# Patient Record
Sex: Female | Born: 1980 | Race: White | Hispanic: Yes | Marital: Married | Smoking: Never smoker
Health system: Southern US, Community
[De-identification: ages and names within clinical notes are randomized; demographics above are authoritative.]

## PROBLEM LIST (undated history)

## (undated) HISTORY — PX: OTHER SURGICAL HISTORY: SHX169

---

## 2008-07-27 ENCOUNTER — Observation Stay: Payer: Self-pay | Admitting: Obstetrics and Gynecology

## 2008-07-31 ENCOUNTER — Inpatient Hospital Stay: Payer: Self-pay | Admitting: Obstetrics and Gynecology

## 2010-10-24 LAB — HM HIV SCREENING LAB: HM HIV Screening: NEGATIVE

## 2010-11-03 ENCOUNTER — Emergency Department: Payer: Self-pay | Admitting: Emergency Medicine

## 2010-12-08 ENCOUNTER — Ambulatory Visit: Payer: Self-pay | Admitting: Advanced Practice Midwife

## 2011-05-20 ENCOUNTER — Inpatient Hospital Stay: Payer: Self-pay

## 2011-05-20 LAB — CBC WITH DIFFERENTIAL/PLATELET
Basophil #: 0 10*3/uL (ref 0.0–0.1)
Basophil %: 0.1 %
Eosinophil %: 0.2 %
HCT: 37.3 % (ref 35.0–47.0)
Lymphocyte #: 1.3 10*3/uL (ref 1.0–3.6)
MCH: 31.3 pg (ref 26.0–34.0)
MCHC: 33.9 g/dL (ref 32.0–36.0)
MCV: 92 fL (ref 80–100)
Monocyte #: 0.5 x10 3/mm (ref 0.2–0.9)
Monocyte %: 3.7 %
Neutrophil #: 13 10*3/uL — ABNORMAL HIGH (ref 1.4–6.5)
Neutrophil %: 87.5 %
RDW: 15.7 % — ABNORMAL HIGH (ref 11.5–14.5)
WBC: 14.9 10*3/uL — ABNORMAL HIGH (ref 3.6–11.0)

## 2011-05-21 LAB — HEMATOCRIT: HCT: 33.8 % — ABNORMAL LOW (ref 35.0–47.0)

## 2013-05-10 IMAGING — US US OB US >=[ID] SNGL FETUS
1 series · 17 of 28 positions shown · non-contrast
Comparison: none

REASON FOR EXAM: dating
COMMENTS:

PROCEDURE:     US  - US OB GREATER/OR EQUAL TO [id number]  - December 08, 2010 [DATE]
RESULT:     Comparison: None.
TECHNIQUE: Multiple grayscale and color Doppler images were obtained of the
pelvis.

[Series 1: us ob us >=(id) sngl fetus · 17 of 75 slices shown]
[im 1/75]
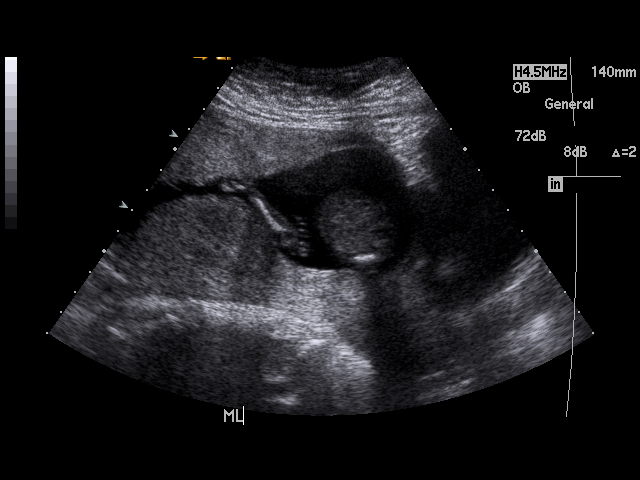
[im 6/75]
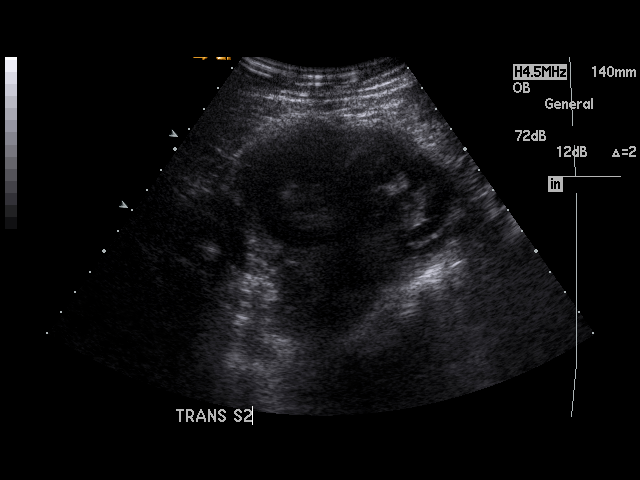
[im 11/75]
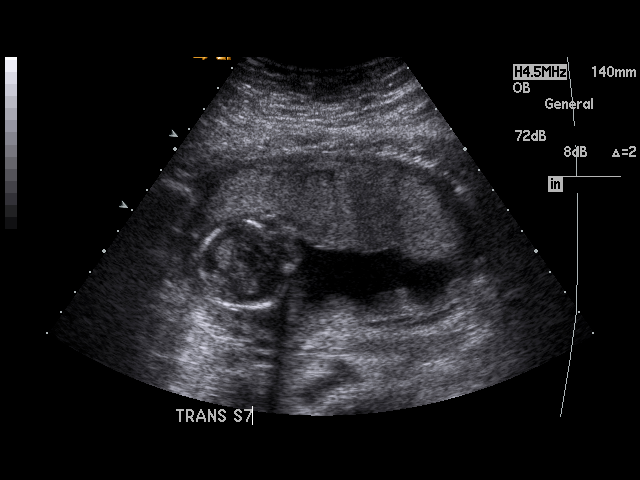
[im 14/75]
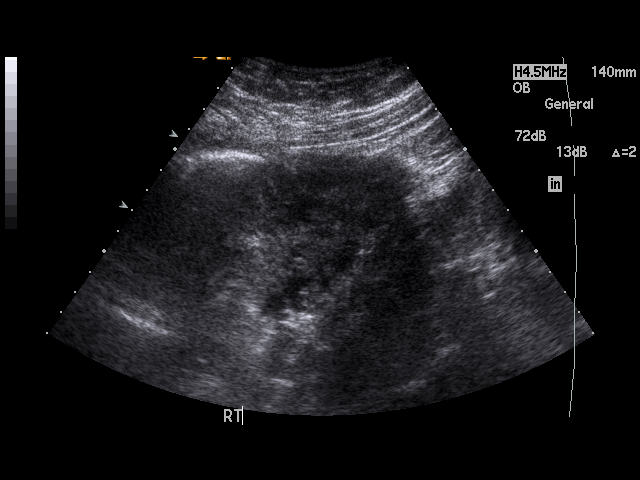
[im 20/75]
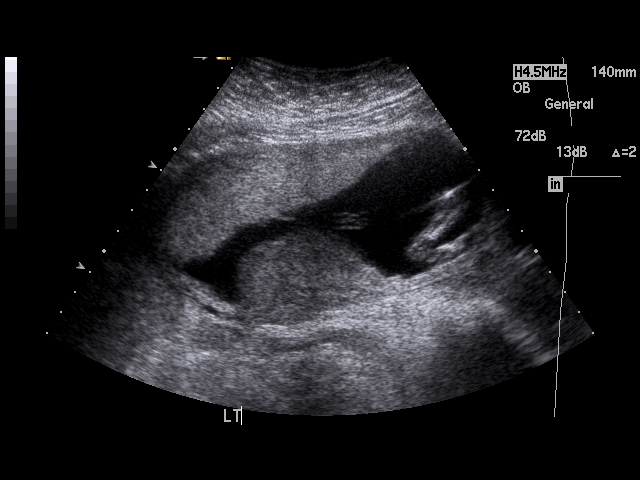
[im 25/75]
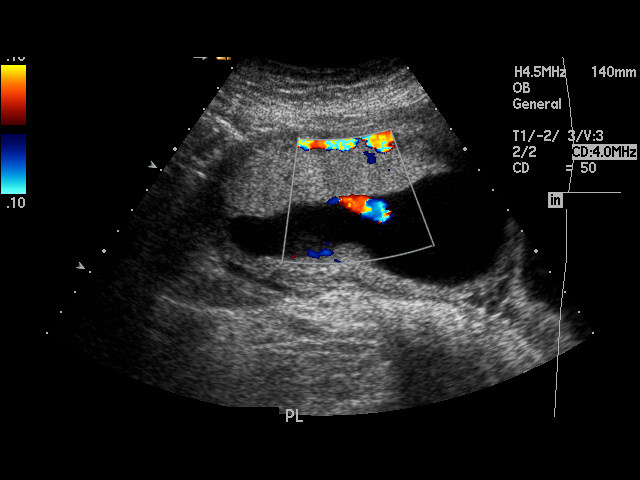
[im 28/75]
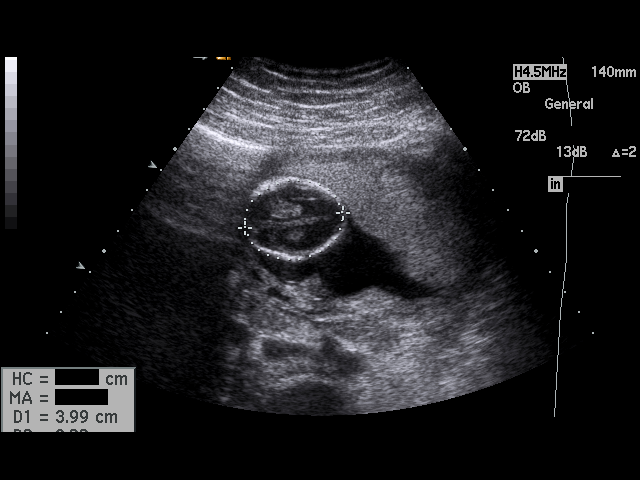
[im 33/75]
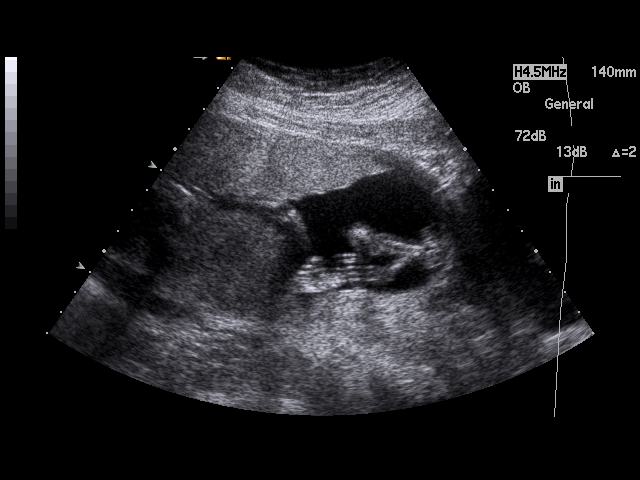
[im 39/75]
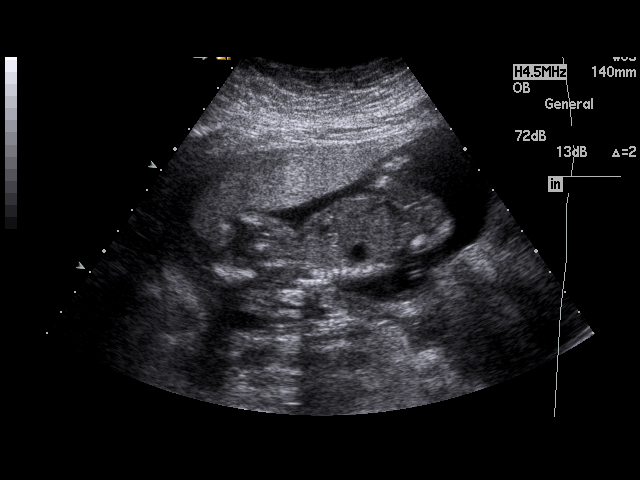
[im 42/75]
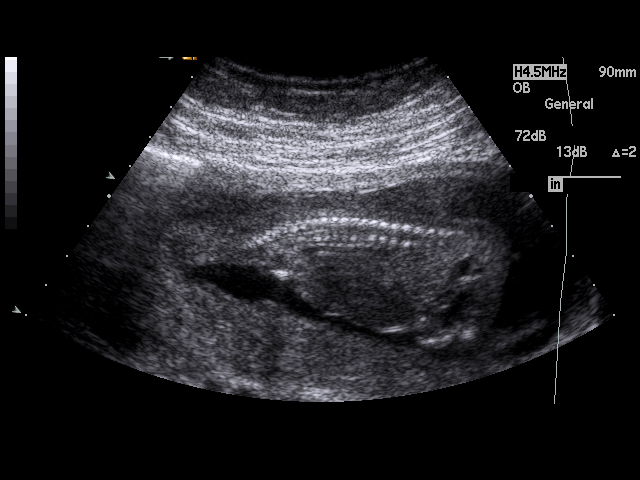
[im 47/75]
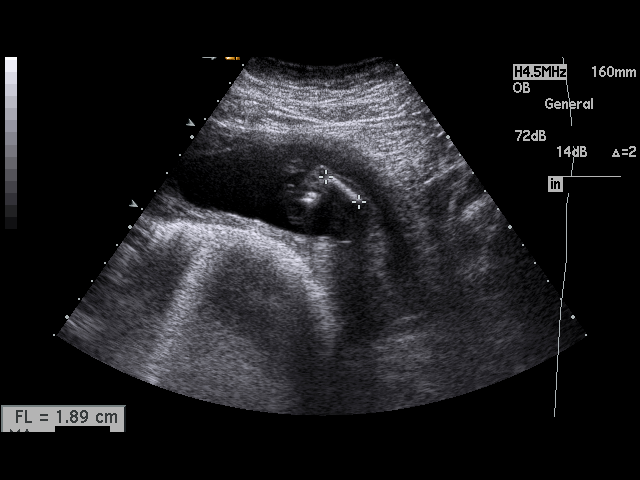
[im 50/75]
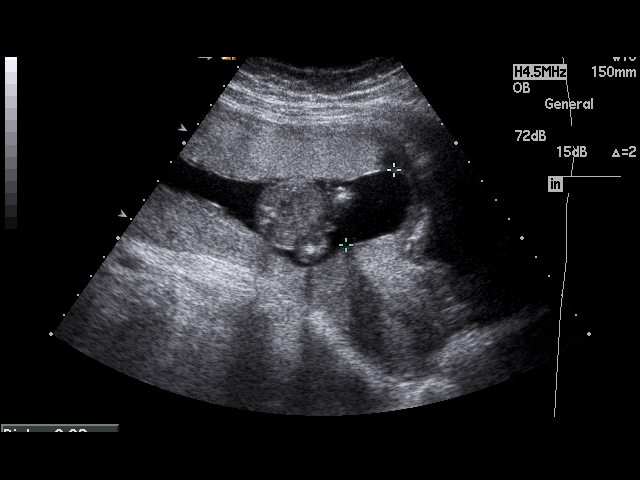
[im 55/75]
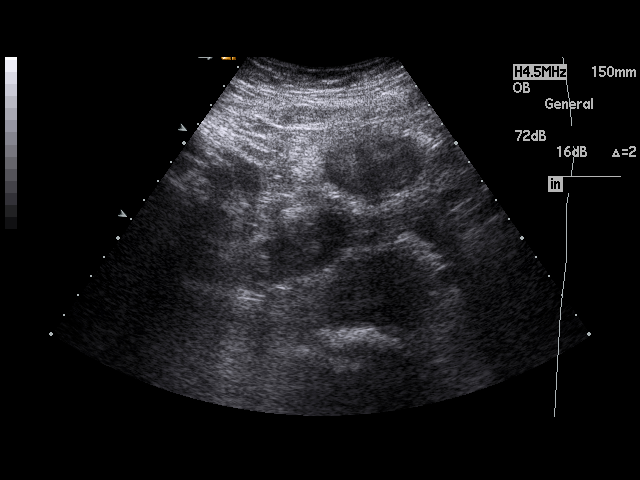
[im 61/75]
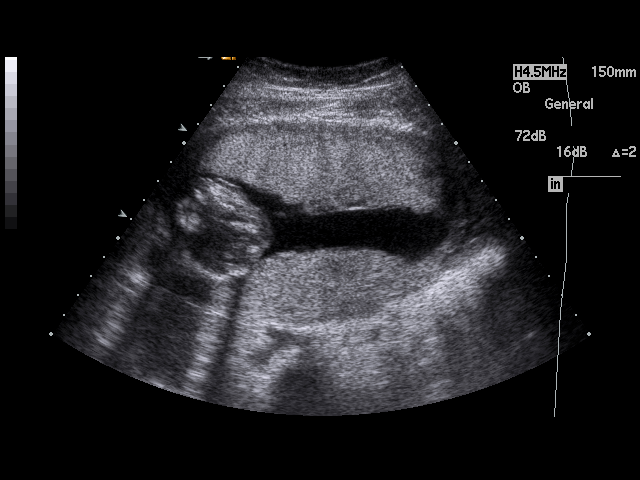
[im 64/75]
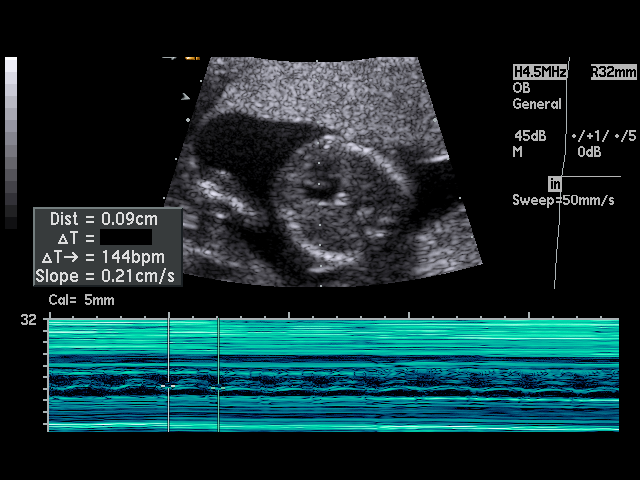
[im 69/75]
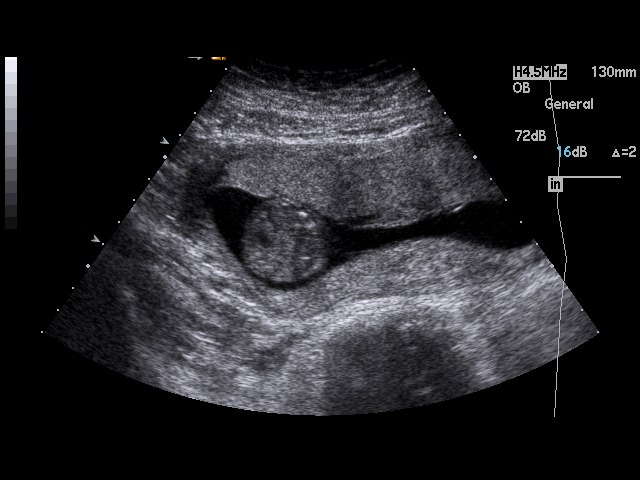
[im 75/75]
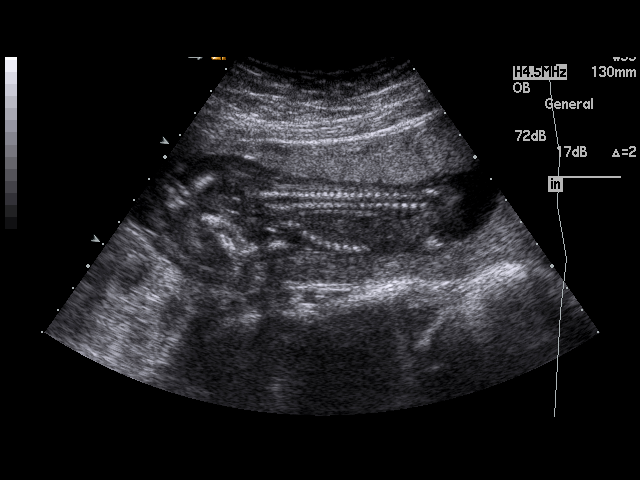

[17 of 28 positions shown; findings below may reference images not displayed]

FINDINGS: There is a single live intrauterine pregnancy with estimated gestational age
of 15 weeks 5 days. The cervix measures 3.9 cm, and is closed. The placenta
is in an anterior location. No evidence of placenta previa. The fetal heart
rate was 144 beats per minute. No fetal anomalies were identified. However,
please note this is not a detailed fetal anatomic study secondary to the
early gestational age. The estimated fetal weight was 162 + / - 22 grams.
IMPRESSION: Single live intrauterine pregnancy with estimated gestational age of 15
weeks 5 days. Please note, this is not a detailed fetal anatomic study,
secondary to the early gestational age.

## 2014-05-19 NOTE — H&P (Signed)
L&D Evaluation:  History:   HPI 34 yo [id number] with LMP of 08/22/10 & EDD of 05/29/11 with PNC at [facility] significant for Hep B hx in [place] at age 34 to 549?, UTI's, PP hemorrhage with pt not remembering whether she received blood. [facility] in [facility] with BBOW and ready to push.    Presents with contractions    Patient's Medical History Hep B, UTI's, PP hemorrhage    Patient's Surgical History none    Medications Pre Natal Vitamins    Allergies NKDA    Social History tobacco  EtOH  drugs  smoked tobacco  in high school, MJ in HS, ETOH occas    Family History Non-Contributory   ROS:   ROS All systems were reviewed.  HEENT, CNS, GI, GU, Respiratory, CV, Renal and Musculoskeletal systems were found to be normal.   Exam:   Vital Signs stable  113/90 during pushing    General no apparent distress    Mental Status clear    Chest clear    Heart normal sinus rhythm, no murmur/gallop/rubs    Abdomen gravid, non-tender    Estimated Fetal Weight Average for gestational age    Back no CVAT    Edema 1+    Reflexes 1+    Clonus negative    Pelvic delivered    Mebranes Ruptured, 1011 AROM, mod meconium    Description brown    FHT normal rate with no decels, FHR on arrival,    Ucx regular    Skin dry    Lymph no lymphadenopathy   Impression:   Impression active labor, IUP at 38 weeks   Plan:   Plan Pt delivered prior to H&P being done   Electronic Signatures: Sharee PimpleJones, Esme Freund W (CNM)  (Signed 11-May-13 10:37)  Authored: L&D Evaluation   Last Updated: 11-May-13 10:37 by Sharee PimpleJones, Clemma Johnsen W (CNM)

## 2014-06-04 DIAGNOSIS — Z8619 Personal history of other infectious and parasitic diseases: Secondary | ICD-10-CM | POA: Insufficient documentation

## 2014-06-04 DIAGNOSIS — E663 Overweight: Secondary | ICD-10-CM | POA: Insufficient documentation

## 2015-06-29 DIAGNOSIS — I10 Essential (primary) hypertension: Secondary | ICD-10-CM | POA: Insufficient documentation

## 2015-06-29 LAB — HM PAP SMEAR: HM Pap smear: NEGATIVE

## 2018-08-01 DIAGNOSIS — E663 Overweight: Secondary | ICD-10-CM

## 2018-08-01 DIAGNOSIS — I10 Essential (primary) hypertension: Secondary | ICD-10-CM

## 2018-08-01 DIAGNOSIS — Z8619 Personal history of other infectious and parasitic diseases: Secondary | ICD-10-CM

## 2018-08-13 ENCOUNTER — Other Ambulatory Visit: Payer: Self-pay

## 2018-08-13 ENCOUNTER — Ambulatory Visit (LOCAL_COMMUNITY_HEALTH_CENTER): Payer: Self-pay

## 2018-08-13 VITALS — BP 134/92 | Ht 63.5 in | Wt 186.5 lb

## 2018-08-13 DIAGNOSIS — Z30011 Encounter for initial prescription of contraceptive pills: Secondary | ICD-10-CM

## 2018-08-13 DIAGNOSIS — Z3009 Encounter for other general counseling and advice on contraception: Secondary | ICD-10-CM

## 2018-08-13 DIAGNOSIS — Z3041 Encounter for surveillance of contraceptive pills: Secondary | ICD-10-CM

## 2018-08-13 MED ORDER — NORETHINDRONE 0.35 MG PO TABS: 1.0000 | ORAL_TABLET | Freq: Every day | ORAL | 0 refills | Status: AC

## 2018-08-13 NOTE — Progress Notes (Signed)
Last physical with [facility] 07/19/2017, order for #13 packs Micronor written and dispensed. RV on 05/14/2018, pt stated she lost OCP, order for #4 packs Norethindrone written and dispensed.

## 2018-08-13 NOTE — Progress Notes (Signed)
Consulted with provider regarding pt request and details regarding BP, menses, & physical. Per verbal order by Antoine Primas, PA, dispensed #3 packs Norethindrone, take 1 tab at the same time every day; also offered UPT, pt refused. Pt counseled that if she misses OCP or takes them late to use a back-up method like condoms.

## 2019-07-10 ENCOUNTER — Other Ambulatory Visit: Payer: Self-pay

## 2019-07-10 ENCOUNTER — Encounter: Payer: Self-pay | Admitting: Physician Assistant

## 2019-07-10 ENCOUNTER — Ambulatory Visit (LOCAL_COMMUNITY_HEALTH_CENTER): Payer: Self-pay | Admitting: Physician Assistant

## 2019-07-10 VITALS — BP 131/91 | Ht 63.0 in | Wt 171.0 lb

## 2019-07-10 DIAGNOSIS — Z0000 Encounter for general adult medical examination without abnormal findings: Secondary | ICD-10-CM

## 2019-07-10 DIAGNOSIS — Z3009 Encounter for other general counseling and advice on contraception: Secondary | ICD-10-CM

## 2019-07-10 DIAGNOSIS — Z113 Encounter for screening for infections with a predominantly sexual mode of transmission: Secondary | ICD-10-CM

## 2019-07-10 DIAGNOSIS — Z3049 Encounter for surveillance of other contraceptives: Secondary | ICD-10-CM

## 2019-07-10 LAB — WET PREP FOR TRICH, YEAST, CLUE
Trichomonas Exam: NEGATIVE
Yeast Exam: NEGATIVE

## 2019-07-10 NOTE — Progress Notes (Signed)
Family Planning Visit- Repeat Yearly Visit  Subjective:  Marilyn Evans is a 39 y.o. [phone]  being seen today for an well woman visit and to discuss family planning options.    She is currently using none for pregnancy prevention. Patient reports she does not  want a pregnancy in the next year. Patient  has Benign hypertension; History of hepatitis B; and Overweight on their problem list.  Chief Complaint  Patient presents with  . Contraception    PE    Patient reports that she does not take any medicine for her high BP, she does not have a PCP right now.  Is concerned that there is something wrong because she has had a lot of yeast infections and urine infections during the last few months.  States that she was last treated for a UTI in April.  Reports that she had been "cleaninig out" her vagina by putting her fingers inside to get the discharge out.  States that she drinks water, but not too much since if she urinates a lot she gets uncomfortable.  Reports some vaginal discharge with itching off and on, especially since she d/c her OCs.    Patient denies other concerns today and changes to personal and family history.   See flowsheet for other program required questions.   Body mass index is 30.29 kg/m. - Patient is eligible for diabetes screening based on BMI and age >36?  yes HA1C ordered? No, patient declines today.   Patient reports 1 of partners in last year. Desires STI screening?  Yes   Has patient been screened once for HCV in the past?  No  No results found for: HCVAB  Does the patient have current of drug use, have a partner with drug use, and/or has been incarcerated since last result? No  If yes-- Screen for HCV through [facility]   Does the patient meet criteria for HBV testing? No  Criteria:  -Household, sexual or needle sharing contact with HBV -History of drug use -HIV positive -Those with known Hep C   Health Maintenance Due  Topic Date Due  .  Hepatitis C Screening  Never done  . COVID-19 Vaccine (1) Never done  . TETANUS/TDAP  Never done    Review of Systems  All other systems reviewed and are negative.   The following portions of the patient's history were reviewed and updated as appropriate: allergies, current medications, past family history, past medical history, past social history, past surgical history and problem list. Problem list updated.  Objective:   Vitals:   07/10/19 1349 07/10/19 1355  BP: (!) 141/93 (!) 131/91  Weight: 171 lb (77.6 kg)   Height: 5\' 3"  (1.6 m)     Physical Exam Vitals and nursing note reviewed.  Constitutional:      General: She is not in acute distress.    Appearance: Normal appearance.  HENT:     Head: Normocephalic and atraumatic.  Eyes:     Conjunctiva/sclera: Conjunctivae normal.  Neck:     Thyroid: No thyroid mass, thyromegaly or thyroid tenderness.  Cardiovascular:     Rate and Rhythm: Normal rate and regular rhythm.  Pulmonary:     Effort: Pulmonary effort is normal.     Breath sounds: Normal breath sounds.  Chest:     Breasts:        Right: Normal.        Left: Normal.  Abdominal:     Palpations: Abdomen is soft. There  is no mass.     Tenderness: There is no abdominal tenderness. There is no guarding or rebound.  Musculoskeletal:     Cervical back: Neck supple. No tenderness.  Lymphadenopathy:     Cervical: No cervical adenopathy.     Upper Body:     Right upper body: No supraclavicular, axillary or pectoral adenopathy.     Left upper body: No supraclavicular, axillary or pectoral adenopathy.  Skin:    General: Skin is warm and dry.  Neurological:     Mental Status: She is alert and oriented to person, place, and time.  Psychiatric:        Mood and Affect: Mood normal.        Behavior: Behavior normal.        Thought Content: Thought content normal.        Judgment: Judgment normal.       Assessment and Plan:  Marilyn Evans is a 39 y.o. female  [phone] presenting to the [facility] Department for an yearly well woman exam/family planning visit   Contraception counseling: Reviewed all forms of birth control options in the tiered based approach. available including abstinence; over the counter/barrier methods; hormonal contraceptive medication including pill, patch, ring, injection,contraceptive implant, ECP; hormonal and nonhormonal IUDs; permanent sterilization options including vasectomy and the various tubal sterilization modalities. Risks, benefits, and typical effectiveness rates were reviewed.  Questions were answered.  Written information was also given to the patient to review.  Patient desires to not use hormonal BCM, this was prescribed for patient. She will follow up in  1 year and prn for surveillance.  She was told to call with any further questions, or with any concerns about this method of contraception.  Emphasized use of condoms 100% of the time for STI prevention.  Patient was offered ECP. ECP was not accepted by the patient.   1. Encounter for counseling regarding contraception Counseled patient that she should use condoms consistently for STD and pregnancy prevention. RTC if changes mind and wants to restart hormonal methods. Counseled that next pap is due in 2022.  2. Screening for STD (sexually transmitted disease) Await test results.  Counseled that RN will call if needs to RTC for treatment once results are back.  - WET PREP FOR TRICH, YEAST, CLUE - Chlamydia/Gonorrhea [facility]  3. Surveillance for contraception barrier or spermicide Enc patient to use condoms always.  4. Well adult exam Reviewed with patient healthy habits to maintain normal BMI. Enc MVI 1 po daily. Counseled patient re: normal vs abnormal vaginal discharge and that it varies based on hormone levels during menstrual cycle. Counseled to stop using any fingers, wash cloth, or other methods to "clean out" her vagina since this can  cause her to have BV, yeast and possible contribute to her UTI. Enc to drink water, cranberry juice and lemonade to help prevent UTI. Enc to establish with PCP for illness, primary care concerns and age appropriate screenings.  Return in about 1 year (around 07/09/2020) for physical or PRN.  No future appointments.  Matt Holmes, PA

## 2019-07-10 NOTE — Progress Notes (Signed)
Wet mount reviewed by provider, no tx per provider orders. Pt declined condoms, Natural Family Planning method, and MVI; not interested in any form of birth control at this time. Provider orders completed.

## 2019-07-10 NOTE — Progress Notes (Signed)
Pt is not interested in any form of birth control, declined MVI.

## 2019-08-13 ENCOUNTER — Ambulatory Visit: Payer: Self-pay

## 2021-11-22 ENCOUNTER — Ambulatory Visit: Payer: Self-pay | Admitting: Podiatry

## 2022-02-01 ENCOUNTER — Other Ambulatory Visit: Payer: Self-pay

## 2022-02-01 ENCOUNTER — Emergency Department: Payer: Medicaid Other

## 2022-02-01 ENCOUNTER — Emergency Department
Admission: EM | Admit: 2022-02-01 | Discharge: 2022-02-01 | Disposition: A | Discharge status: Home / Self Care | Payer: Medicaid Other | Attending: Emergency Medicine | Admitting: Emergency Medicine

## 2022-02-01 DIAGNOSIS — O26891 Other specified pregnancy related conditions, first trimester: Secondary | ICD-10-CM | POA: Diagnosis present

## 2022-02-01 DIAGNOSIS — O00102 Left tubal pregnancy without intrauterine pregnancy: Secondary | ICD-10-CM | POA: Diagnosis not present

## 2022-02-01 DIAGNOSIS — O0091 Unspecified ectopic pregnancy with intrauterine pregnancy: Secondary | ICD-10-CM | POA: Insufficient documentation

## 2022-02-01 DIAGNOSIS — O209 Hemorrhage in early pregnancy, unspecified: Secondary | ICD-10-CM | POA: Insufficient documentation

## 2022-02-01 DIAGNOSIS — Z3A08 8 weeks gestation of pregnancy: Secondary | ICD-10-CM | POA: Insufficient documentation

## 2022-02-01 DIAGNOSIS — Z3A01 Less than 8 weeks gestation of pregnancy: Secondary | ICD-10-CM

## 2022-02-01 DIAGNOSIS — O009 Unspecified ectopic pregnancy without intrauterine pregnancy: Secondary | ICD-10-CM

## 2022-02-01 LAB — HCG, QUANTITATIVE, PREGNANCY: hCG, Beta Chain, Quant, S: 615 m[IU]/mL — ABNORMAL HIGH (ref <5)

## 2022-02-01 LAB — COMPREHENSIVE METABOLIC PANEL
ALT: 11 U/L (ref 0–44)
AST: 15 U/L (ref 15–41)
Albumin: 3.6 g/dL (ref 3.5–5.0)
Alkaline Phosphatase: 53 U/L (ref 38–126)
Anion gap: 6 (ref 5–15)
BUN: 9 mg/dL (ref 6–20)
CO2: 25 mmol/L (ref 22–32)
Calcium: 8.6 mg/dL — ABNORMAL LOW (ref 8.9–10.3)
Chloride: 105 mmol/L (ref 98–111)
Creatinine, Ser: 0.6 mg/dL (ref 0.44–1.00)
GFR, Estimated: >60 mL/min (ref >60)
Glucose, Bld: 98 mg/dL (ref 70–99)
Potassium: 3.6 mmol/L (ref 3.5–5.1)
Sodium: 136 mmol/L (ref 135–145)
Total Bilirubin: 0.8 mg/dL (ref 0.3–1.2)
Total Protein: 6.9 g/dL (ref 6.5–8.1)

## 2022-02-01 LAB — URINALYSIS, ROUTINE W REFLEX MICROSCOPIC
Bacteria, UA: NONE SEEN
Bilirubin Urine: NEGATIVE
Glucose, UA: NEGATIVE mg/dL
Ketones, ur: NEGATIVE mg/dL
Leukocytes,Ua: NEGATIVE
Nitrite: NEGATIVE
Protein, ur: NEGATIVE mg/dL
Specific Gravity, Urine: 1.019 (ref 1.005–1.030)
pH: 6 (ref 5.0–8.0)

## 2022-02-01 LAB — CBC
HCT: 32.2 % — ABNORMAL LOW (ref 36.0–46.0)
Hemoglobin: 10.1 g/dL — ABNORMAL LOW (ref 12.0–15.0)
MCH: 25.2 pg — ABNORMAL LOW (ref 26.0–34.0)
MCHC: 31.4 g/dL (ref 30.0–36.0)
MCV: 80.3 fL (ref 80.0–100.0)
Platelets: 223 10*3/uL (ref 150–400)
RBC: 4.01 MIL/uL (ref 3.87–5.11)
WBC: 6.9 10*3/uL (ref 4.0–10.5)
nRBC: 0 % (ref 0.0–0.2)

## 2022-02-01 LAB — LIPASE, BLOOD: Lipase: 27 U/L (ref 11–51)

## 2022-02-01 LAB — ABO/RH: ABO/RH(D): B POS

## 2022-02-01 LAB — POC URINE PREG, ED: Preg Test, Ur: POSITIVE — AB

## 2022-02-01 MED ORDER — METHOTREXATE FOR ECTOPIC PREGNANCY
50.0000 mg/m2 | Freq: Once | INTRAMUSCULAR | Status: AC
  Administered 2022-02-01: 92.5 mg via INTRAMUSCULAR
  Filled 2022-02-01: qty 3.7

## 2022-02-01 MED ORDER — ONDANSETRON 4 MG PO TBDP: 4.0000 mg | ORAL_TABLET | Freq: Four times a day (QID) | ORAL | 0 refills | Status: AC | PRN

## 2022-02-01 MED ORDER — OXYCODONE HCL 5 MG PO TABS: 5.0000 mg | ORAL_TABLET | Freq: Four times a day (QID) | ORAL | 0 refills | Status: AC | PRN

## 2022-02-01 MED ORDER — FENTANYL CITRATE PF 50 MCG/ML IJ SOSY
50.0000 ug | PREFILLED_SYRINGE | Freq: Once | INTRAMUSCULAR | Status: AC
  Administered 2022-02-01: 50 ug via INTRAVENOUS
  Filled 2022-02-01: qty 1

## 2022-02-01 NOTE — ED Notes (Signed)
Pt to ultrasound

## 2022-02-01 NOTE — Consult Note (Signed)
Obstetrics & Gynecology Consult H&P   Consulting Department:OB GYN  Consulting Physician: Siri Cole, CNM   Consulting Question: Pt with abdominal pain, vaginal bleeding, with possible ectopic pregnancy    History of Present Illness: Patient is a 42 y.o. [id number].  She experienced vaginal bleeding and lower abdominal pain x 1 week. She saw her provider at [facility] on Monday, she was told she was having a miscarriage. She a urine pregnancy test, but an ultrasound and beta HCG were not collected. She was instructed to come to the ED to be evaluated. Patrici reports the pain got worse yesterday, it was so bad she could hardly stand up. She reports she is lightly bleeding.  Her beta is 615, US shows Findings highly suspicious for early left-sided ectopic pregnancy, her blood type is B positive. This was not a planned pregnancy, she was not using contraception at the time she conceived. She does not desire future pregnancies.   Review of Systems:10 point review of systems  Past Medical History:  Patient Active Problem List   Diagnosis Date Noted   Benign hypertension 06/29/2015   History of hepatitis B 06/04/2014    As a 42 y/o [place]    Overweight 06/04/2014    Past Surgical History:  Past Surgical History:  Procedure Laterality Date   denies      Gynecologic History:   Obstetric History: [id number]  Family History:  Family History  Problem Relation Age of Onset   Hypertension Mother    CVA Mother    Hypertension Father     Social History:  Social History   Socioeconomic History   Marital status: Married    Spouse name: Not on file   Number of children: Not on file   Years of education: Not on file   Highest education level: Not on file  Occupational History   Not on file  Tobacco Use   Smoking status: Never   Smokeless tobacco: Never  Vaping Use   Vaping Use: Never used  Substance and Sexual Activity   Alcohol use: Yes    Comment: wine on  special occassions   Drug use: Not Currently    Types: Marijuana    Comment: "when I was a teenager"   Sexual activity: Yes    Birth control/protection: None  Other Topics Concern   Not on file  Social History Narrative   Not on file   Social Determinants of Health   Financial Resource Strain: Not on file  Food Insecurity: Not on file  Transportation Needs: Not on file  Physical Activity: Not on file  Stress: Not on file  Social Connections: Not on file  Intimate Partner Violence: Not At Risk (07/10/2019)   Humiliation, Afraid, Rape, and Kick questionnaire    Fear of Current or Ex-Partner: No    Emotionally Abused: No    Physically Abused: No    Sexually Abused: No    Allergies:  No Known Allergies  Medications: Prior to Admission medications   Medication Sig Start Date End Date Taking? Authorizing Provider  ondansetron (ZOFRAN-ODT) 4 MG disintegrating tablet Take 1 tablet (4 mg total) by mouth every 6 (six) hours as needed for nausea. 02/01/22  Yes Nikiah Goin, Courtney Heys, CNM  oxyCODONE (ROXICODONE) 5 MG immediate release tablet Take 1 tablet (5 mg total) by mouth every 6 (six) hours as needed for severe pain. 02/01/22  Yes Stefano Trulson, Courtney Heys, CNM  norethindrone (MICRONOR) 0.35 MG tablet Take 1 tablet (0.35  mg total) by mouth daily. Patient not taking: Reported on 07/10/2019 08/13/18   Jerene Dilling, PA    Physical Exam Vitals: Blood pressure (!) 154/78, pulse 76, temperature 98.4 F (36.9 C), temperature source Oral, resp. rate 16, height 5\' 3"  (1.6 m), weight 77.6 kg, last menstrual period 12/02/2021, SpO2 98 %. General: NAD HEENT: normocephalic, anicteric Pulmonary: No increased work of breathing Genitourinary: not assessed  Extremities: no edema, erythema, or tenderness Neurologic: Grossly intact Psychiatric: mood appropriate, affect full  Labs: Results for orders placed or performed during the hospital encounter of 02/01/22 (from the past 72 hour(s))  Lipase,  blood     Status: None   Collection Time: 02/01/22  9:23 AM  Result Value Ref Range   Lipase 27 11 - 51 U/L    Comment: Performed at [facility], [address] [place], [place] [address]  Comprehensive metabolic panel     Status: Abnormal   Collection Time: 02/01/22  9:23 AM  Result Value Ref Range   Sodium 136 135 - 145 mmol/L   Potassium 3.6 3.5 - 5.1 mmol/L   Chloride 105 98 - 111 mmol/L   CO2 25 22 - 32 mmol/L   Glucose, Bld 98 70 - 99 mg/dL    Comment: Glucose reference range applies only to samples taken after fasting for at least 8 hours.   BUN 9 6 - 20 mg/dL   Creatinine, Ser 0.60 0.44 - 1.00 mg/dL   Calcium 8.6 (L) 8.9 - 10.3 mg/dL   Total Protein 6.9 6.5 - 8.1 g/dL   Albumin 3.6 3.5 - 5.0 g/dL   AST 15 15 - 41 U/L   ALT 11 0 - 44 U/L   Alkaline Phosphatase 53 38 - 126 U/L   Total Bilirubin 0.8 0.3 - 1.2 mg/dL   GFR, Estimated >60 >60 mL/min    Comment: (NOTE) Calculated using the CKD-EPI Creatinine Equation (2021)    Anion gap 6 5 - 15    Comment: Performed at [facility], [address] [place], [place] [address]  CBC     Status: Abnormal   Collection Time: 02/01/22  9:23 AM  Result Value Ref Range   WBC 6.9 4.0 - 10.5 K/uL   RBC 4.01 3.87 - 5.11 MIL/uL   Hemoglobin 10.1 (L) 12.0 - 15.0 g/dL   HCT 32.2 (L) 36.0 - 46.0 %   MCV 80.3 80.0 - 100.0 fL   MCH 25.2 (L) 26.0 - 34.0 pg   MCHC 31.4 30.0 - 36.0 g/dL   RDW Not Measured 11.5 - 15.5 %   Platelets 223 150 - 400 K/uL   nRBC 0.0 0.0 - 0.2 %    Comment: Performed at [facility], [address] [place], [place] [address]  Urinalysis, Routine w reflex microscopic -     Status: Abnormal   Collection Time: 02/01/22  9:27 AM  Result Value Ref Range   Color, Urine YELLOW (A) YELLOW   APPearance HAZY (A) CLEAR   Specific Gravity, Urine 1.019 1.005 - 1.030   pH 6.0 5.0 - 8.0   Glucose, UA NEGATIVE NEGATIVE mg/dL   Hgb urine dipstick LARGE (A) NEGATIVE   Bilirubin Urine NEGATIVE  NEGATIVE   Ketones, ur NEGATIVE NEGATIVE mg/dL   Protein, ur NEGATIVE NEGATIVE mg/dL   Nitrite NEGATIVE NEGATIVE   Leukocytes,Ua NEGATIVE NEGATIVE   RBC / HPF 0-5 0 - 5 RBC/hpf   WBC, UA 0-5 0 - 5 WBC/hpf   Bacteria, UA  NONE SEEN NONE SEEN   Squamous Epithelial / HPF 0-5 0 - 5 /HPF   Mucus PRESENT     Comment: Performed at [facility], [address] [place], [place] [address]  hCG, quantitative, pregnancy     Status: Abnormal   Collection Time: 02/01/22  9:27 AM  Result Value Ref Range   hCG, Beta Chain, Quant, S 615 (H) <5 mIU/mL    Comment:          GEST. AGE      CONC.  (mIU/mL)   <=1 WEEK        5 - 50     2 WEEKS       50 - 500     3 WEEKS       100 - 10,000     4 WEEKS     1,000 - 30,000     5 WEEKS     3,500 - 115,000   6-8 WEEKS     12,000 - 270,000    12 WEEKS     15,000 - 220,000        FEMALE AND NON-PREGNANT FEMALE:     LESS THAN 5 mIU/mL Performed at [facility], [address] [place], [place] [address]   ABO/Rh     Status: None   Collection Time: 02/01/22  9:27 AM  Result Value Ref Range   ABO/RH(D)      B POS Performed at [facility], [address] [place], [place] [address]   POC urine preg, ED     Status: Abnormal   Collection Time: 02/01/22  9:42 AM  Result Value Ref Range   Preg Test, Ur POSITIVE (A) NEGATIVE    Comment:        THE SENSITIVITY OF THIS METHODOLOGY IS >24 mIU/mL     Imaging US OB LESS THAN 14 WEEKS WITH OB TRANSVAGINAL  Result Date: 02/01/2022 CLINICAL DATA:  Positive pregnancy test with pelvic pain. EXAM: OBSTETRIC <14 WK ULTRASOUND TECHNIQUE: Transabdominal ultrasound was performed for evaluation of the gestation as well as the maternal uterus and adnexal regions. COMPARISON:  None Available. FINDINGS: Intrauterine gestational sac: None Yolk sac:  N/A Embryo:  N/A Cardiac Activity: N/A Heart Rate: N/A bpm Subchorionic hemorrhage:  None visualized. Maternal uterus/adnexae: Normal endometrial  thickness. No endometrial fluid. Nabothian cysts are noted at the cervix. Small left adnexal lesion has the appearance of a small gestational sac. No embryo or yolk sac. No significant blood flow around the lesion but it is clearly separate from the left ovary and is highly suspicious for an early ectopic pregnancy given the patient's positive pregnancy test. Both ovaries are normal. No free pelvic fluid. IMPRESSION: Findings highly suspicious for early left-sided ectopic pregnancy as detailed above. No intrauterine gestational sac is identified. No free pelvic fluid or pelvic hematoma. Electronically Signed   By: Rudie Meyer M.D.   On: 02/01/2022 12:19    Assessment: 42 y.o. [id number]  Plan: Reviewed US findings: Given that  that is not a viable pregnancy and could be harmful to the pt intervention is reccommended.  Discussed options 1) methotrexate 2) surgery. Advantages/risks/benefits and side effects off all options reviewed.   Pt desires Methotrexate. Is concerned about pain management, desires script for a narcotic as NSAID not recommended with Methotrexate.   Methotrexate ordered Pt to fu with her PCP or Ed on day 4 or 7, ok to go the MAU in [place].   Pt's history, [place] and  plan reviewed with Dr Zollie Pee, [facility]  02/01/2022 10:37 PM

## 2022-02-01 NOTE — ED Provider Notes (Signed)
[facility] Provider Note    Event Date/Time   First MD Initiated Contact with Patient 02/01/22 1010     (approximate)   History   Abdominal Pain   HPI  Marilyn Evans is a 42 y.o. female  who presents to the emergency department today because of concern for abdominal pain in setting of miscarriage. She states that for the past week she has been having vaginal bleeding and lower abdominal pain. The pain has gradually gotten worse. She saw her doctor 2 days ago who felt she was having a miscarriage. No [place] was done at that time.  Patient thinks she may have been about 2 months pregnant but does not know for sure.  Physical Exam   Triage Vital Signs: ED Triage Vitals  Enc Vitals Group     BP 02/01/22 0922 (!) 162/89     Pulse Rate 02/01/22 0921 75     Resp 02/01/22 0921 18     Temp 02/01/22 0921 98.4 F (36.9 C)     Temp Source 02/01/22 0921 Oral     SpO2 02/01/22 0921 99 %     Weight 02/01/22 0922 171 lb 1.2 oz (77.6 kg)     Height 02/01/22 0922 5\' 3"  (1.6 m)     Head Circumference --      Peak Flow --      Pain Score 02/01/22 0922 7     Pain Loc --      Pain Edu? --      Excl. in [place]? --     Most recent vital signs: Vitals:   02/01/22 0921 02/01/22 0922  BP:  (!) 162/89  Pulse: 75   Resp: 18   Temp: 98.4 F (36.9 C)   SpO2: 99%    General: Awake, alert, oriented. CV:  Good peripheral perfusion. Regular rate and rhythm. Resp:  Normal effort.  Abd:  No distention. Minimal tenderness to palpation in the lower abdomen.  ED Results / Procedures / Treatments   Labs (all labs ordered are listed, but only abnormal results are displayed) Labs Reviewed  COMPREHENSIVE METABOLIC PANEL - Abnormal; Notable for the following components:      Result Value   Calcium 8.6 (*)    All other components within normal limits  CBC - Abnormal; Notable for the following components:   Hemoglobin 10.1 (*)    HCT 32.2 (*)    MCH 25.2 (*)    All other  components within normal limits  URINALYSIS, ROUTINE W REFLEX MICROSCOPIC - Abnormal; Notable for the following components:   Color, Urine YELLOW (*)    APPearance HAZY (*)    Hgb urine dipstick LARGE (*)    All other components within normal limits  HCG, QUANTITATIVE, PREGNANCY - Abnormal; Notable for the following components:   hCG, Beta Chain, Quant, S 615 (*)    All other components within normal limits  POC URINE PREG, ED - Abnormal; Notable for the following components:   Preg Test, Ur POSITIVE (*)    All other components within normal limits  LIPASE, BLOOD  ABO/RH     EKG  None   RADIOLOGY I independently interpreted and visualized the [place]. My interpretation: No intrauterine pregnancy Radiology interpretation:  IMPRESSION:  Findings highly suspicious for early left-sided ectopic pregnancy as  detailed above. No intrauterine gestational sac is identified. No  free pelvic fluid or pelvic hematoma.     PROCEDURES:  Critical Care performed: No  Procedures  MEDICATIONS ORDERED IN ED: Medications - No data to display   IMPRESSION / MDM / [place] / ED COURSE  I reviewed the triage vital signs and the nursing notes.                              Differential diagnosis includes, but is not limited to, ectopic, miscarriage, bleeding in early pregnancy.  Patient's presentation is most consistent with acute presentation with potential threat to life or bodily function.  Patient presented to the emergency department today because of concern for abdominal pain and vaginal bleeding in the setting of early pregnancy and possible miscarriage.  Patient's urine was positive for pregnancy here.  Did obtain an ultrasound which was concerning for ectopic pregnancy.  Did contact ob/gyn. They evaluated the patient and ordered methotraxate. Did discuss with patient after administration importance of repeat blood work. Will discharge with prescriptions prepared by  ob/gyn.     FINAL CLINICAL IMPRESSION(S) / ED DIAGNOSES   Final diagnoses:  Ectopic pregnancy, unspecified location, unspecified whether intrauterine pregnancy present      Note:  This document was prepared using Dragon voice recognition software and may include unintentional dictation errors.    Nance Pear, MD 02/01/22 [phone]

## 2022-02-01 NOTE — ED Notes (Signed)
Called PACU to see if they can send RN to give IM methotrexate. They will send someone down to give.

## 2022-02-01 NOTE — ED Notes (Signed)
Called lab to see if they can run ordered Hcg. All other bloodwork has been run. They will look into it.

## 2022-02-01 NOTE — Discharge Instructions (Signed)
Following Methotrexate Administration for Ectopic Pregnancy  Your physician will obtain follow-up blood work to monitor the effect of the medication.   After receiving methotrexate avoid:  Alcoholic beverages  Vitamins containing folic acid Foods that contain folic acid, including fortified cereal, enriched bread and pasta, peanuts, dark green leafy vegetables, orange juice, and beans  Gas-forming foods  Nonsteroidal antiinflammatory painkillers  Sexual intercourse or any strenuous activity because it may cause the fallopian tube to rupture Do not become pregnant for 3 months to decrease the risk of birth defects.  You may experience side effects, like nausea, vomiting, dizziness, and mouth and lip ulcers.  Most women have abdominal pain a couple of days after the injection.  Notify your obstetric practitioner or return to the emergency department if you develop severe abdominal pain, dizziness or fainting, heavy vaginal bleeding, severe nausea and vomiting, or fever.  Double-flush the toilet with the lid closed for 72 hours after receiving the injection.   

## 2022-02-01 NOTE — ED Triage Notes (Signed)
Pt here with abd pain. Pt states she had a recent miscarriage and has been bleeding a lot. Pt also states the pain is severe. Pt states the bleeding started last Sun but now the bleeding is light but the pain is still there. Pt is unable to have a bowel movement as well due to the pain.

## 2022-02-01 NOTE — ED Notes (Signed)
See triage note. Provider talked to pt at bedside. Pt walked to room with steady gait. Denies dizziness. States is hardly bleeding now, was bleeding more last week after SAB. States is constipated. Had small BM this AM and abdomen is painful.

## 2022-02-05 ENCOUNTER — Emergency Department
Admission: EM | Admit: 2022-02-05 | Discharge: 2022-02-05 | Disposition: A | Discharge status: Home / Self Care | Payer: Medicaid Other | Attending: Emergency Medicine | Admitting: Emergency Medicine

## 2022-02-05 ENCOUNTER — Other Ambulatory Visit: Payer: Self-pay

## 2022-02-05 DIAGNOSIS — Z3A00 Weeks of gestation of pregnancy not specified: Secondary | ICD-10-CM | POA: Insufficient documentation

## 2022-02-05 DIAGNOSIS — O26899 Other specified pregnancy related conditions, unspecified trimester: Secondary | ICD-10-CM | POA: Diagnosis present

## 2022-02-05 DIAGNOSIS — O009 Unspecified ectopic pregnancy without intrauterine pregnancy: Secondary | ICD-10-CM | POA: Diagnosis not present

## 2022-02-05 LAB — URINALYSIS, ROUTINE W REFLEX MICROSCOPIC
Bilirubin Urine: NEGATIVE
Glucose, UA: NEGATIVE mg/dL
Hgb urine dipstick: NEGATIVE
Ketones, ur: NEGATIVE mg/dL
Leukocytes,Ua: NEGATIVE
Nitrite: NEGATIVE
Protein, ur: NEGATIVE mg/dL
Specific Gravity, Urine: 1.024 (ref 1.005–1.030)
pH: 7 (ref 5.0–8.0)

## 2022-02-05 LAB — HCG, QUANTITATIVE, PREGNANCY: hCG, Beta Chain, Quant, S: 99 m[IU]/mL — ABNORMAL HIGH (ref <5)

## 2022-02-05 NOTE — ED Triage Notes (Signed)
Pt comes with just needing recheck of hcg from recent ectopic.

## 2022-02-05 NOTE — ED Provider Notes (Signed)
[facility] Provider Note  Patient Contact: 3:13 PM (approximate)   History   Abnormal Lab   HPI  Marilyn Evans is a 42 y.o. female with a history of recently diagnosed left-sided ectopic pregnancy, presents to the emergency department to have her beta-hCG rechecked.  Patient reports that her left-sided pelvic pain has improved significantly and she states that she only has occasional, mild discomfort at night.  She does report that she has developed some new burning sensation that occurs after urination.  She also complains of some mild upper back pain with no associated shortness of breath, chest tightness or chest pain.  No lower extremity swelling.  Patient does report that pain is reproducible with palpation.  No nausea, vomiting or abdominal pain.  No fever.      Physical Exam   Triage Vital Signs: ED Triage Vitals [02/05/22 1402]  Enc Vitals Group     BP (!) 142/88     Pulse Rate 90     Resp 17     Temp 98.4 F (36.9 C)     Temp src      SpO2 98 %     Weight      Height      Head Circumference      Peak Flow      Pain Score 4     Pain Loc      Pain Edu?      Excl. in [place]?     Most recent vital signs: Vitals:   02/05/22 1402  BP: (!) 142/88  Pulse: 90  Resp: 17  Temp: 98.4 F (36.9 C)  SpO2: 98%     General: Alert and in no acute distress. Eyes:  PERRL. EOMI. Head: No acute traumatic findings ENT:      Nose: No congestion/rhinnorhea.      Mouth/Throat: Mucous membranes are moist. Neck: No stridor. No cervical spine tenderness to palpation. Cardiovascular:  Good peripheral perfusion Respiratory: Normal respiratory effort without tachypnea or retractions. Lungs CTAB. Good air entry to the bases with no decreased or absent breath sounds. Gastrointestinal: Bowel sounds 4 quadrants. Soft and nontender to palpation. No guarding or rigidity. No palpable masses. No distention. No CVA tenderness. Musculoskeletal: Full range of  motion to all extremities.  Patient has upper trapezius pain to palpation Neurologic:  No gross focal neurologic deficits are appreciated.  Skin:   No rash noted    ED Results / Procedures / Treatments   Labs (all labs ordered are listed, but only abnormal results are displayed) Labs Reviewed  HCG, QUANTITATIVE, PREGNANCY - Abnormal; Notable for the following components:      Result Value   hCG, Beta Chain, Quant, S 99 (*)    All other components within normal limits  URINALYSIS, ROUTINE W REFLEX MICROSCOPIC - Abnormal; Notable for the following components:   Color, Urine YELLOW (*)    APPearance HAZY (*)    All other components within normal limits       PROCEDURES:  Critical Care performed: No  Procedures   MEDICATIONS ORDERED IN ED: Medications - No data to display   IMPRESSION / MDM / [place] / ED COURSE  I reviewed the triage vital signs and the nursing notes.                              Assessment and plan:  Dysuria:  Ectopic pregnancy 42 year old female  with recently diagnosed left-sided ectopic pregnancy, presents to the emergency department to have the beta-hCG rechecked.  Patient overall is improving and her pain is mostly resolved.  Vital signs are reassuring at triage.  On exam, patient was alert and nontoxic-appearing.  Patient did complain of some upper back pain that has been new since she took her methotrexate.  Pain was reproducible to palpation and patient has had no shortness of breath, cough and there is no pleuritic nature of the pain that would increase my suspicion of PE.  Patient also complaining today of dysuria.  Will check urinalysis and reassess.  Patient's beta hCG is trending down appropriately from 615-99 today.  Patient was advised to return in 3 days for repeat beta-hCG.  I recommended complying with these recommendations.      FINAL CLINICAL IMPRESSION(S) / ED DIAGNOSES   Final diagnoses:  Ectopic pregnancy,  unspecified location, unspecified whether intrauterine pregnancy present     Rx / DC Orders   ED Discharge Orders     None        Note:  This document was prepared using Dragon voice recognition software and may include unintentional dictation errors.   Vallarie Mare [place], PA-C 02/05/22 1911    Rada Hay, MD 02/06/22 1739

## 2022-02-05 NOTE — Discharge Instructions (Signed)
Please return in 3 days for final beta-hCG. Your beta hCG is trending down today. Your urinalysis shows no signs of UTI

## 2022-02-08 ENCOUNTER — Emergency Department
Admission: EM | Admit: 2022-02-08 | Discharge: 2022-02-08 | Disposition: A | Discharge status: Home / Self Care | Payer: Medicaid Other | Attending: Emergency Medicine | Admitting: Emergency Medicine

## 2022-02-08 ENCOUNTER — Other Ambulatory Visit: Payer: Self-pay

## 2022-02-08 DIAGNOSIS — O009 Unspecified ectopic pregnancy without intrauterine pregnancy: Secondary | ICD-10-CM | POA: Insufficient documentation

## 2022-02-08 DIAGNOSIS — Z3A01 Less than 8 weeks gestation of pregnancy: Secondary | ICD-10-CM | POA: Insufficient documentation

## 2022-02-08 DIAGNOSIS — O26891 Other specified pregnancy related conditions, first trimester: Secondary | ICD-10-CM | POA: Diagnosis present

## 2022-02-08 LAB — HCG, QUANTITATIVE, PREGNANCY: hCG, Beta Chain, Quant, S: 40 m[IU]/mL — ABNORMAL HIGH (ref <5)

## 2022-02-08 NOTE — ED Triage Notes (Signed)
Arrives for repeat HCG from recent ectopic

## 2022-02-08 NOTE — ED Notes (Signed)
Pt here for third serial hcg draw. Pt denies increasing pain or swelling to abdomen. Recently treated with methotrexate for early ectopic pregnancy.

## 2022-02-08 NOTE — ED Provider Notes (Signed)
[facility] Provider Note    Event Date/Time   First MD Initiated Contact with Patient 02/08/22 0815     (approximate)   History   blood work   HPI  Marilyn Evans is a 42 y.o. female   returns for a repeat beta-hCG.  Patient denies any continued problems and rarely has any vaginal bleeding.  Patient was seen on 02/01/2022 at which time she was told she had an ectopic pregnancy.  She has continued to trend downward in her beta hCGs.      Physical Exam   Triage Vital Signs: ED Triage Vitals  Enc Vitals Group     BP 02/08/22 0811 (!) 140/94     Pulse Rate 02/08/22 0811 68     Resp --      Temp 02/08/22 0811 98 F (36.7 C)     Temp src --      SpO2 02/08/22 0811 100 %     Weight 02/08/22 0808 171 lb 1.2 oz (77.6 kg)     Height 02/08/22 0808 5\' 3"  (1.6 m)     Head Circumference --      Peak Flow --      Pain Score 02/08/22 0808 0     Pain Loc --      Pain Edu? --      Excl. in [place]? --     Most recent vital signs: Vitals:   02/08/22 0811 02/08/22 0920  BP: (!) 140/94   Pulse: 68   Resp:  16  Temp: 98 F (36.7 C)   SpO2: 100%      General: Awake, no distress.  CV:  Good peripheral perfusion.  Resp:  Normal effort.  Abd:  No distention.  Other:     ED Results / Procedures / Treatments   Labs (all labs ordered are listed, but only abnormal results are displayed) Labs Reviewed  HCG, QUANTITATIVE, PREGNANCY - Abnormal; Notable for the following components:      Result Value   hCG, Beta Chain, Quant, S 40 (*)    All other components within normal limits     PROCEDURES:  Critical Care performed:   Procedures   MEDICATIONS ORDERED IN ED: Medications - No data to display   IMPRESSION / MDM / [place] / ED COURSE  I reviewed the triage vital signs and the nursing notes.   Differential diagnosis includes, but is not limited to, ectopic pregnancy, [place] beta-hCG to make sure this is trending  downward.  42 year old female presents to the ED for repeat on her hCG that has been trending down.  She was here on 02/01/22 and also on 02/05/2022 with both of these trending downward starting around the 600s and her last 1 was 99.  Today she is reassured that is continuing to trend downward with her results being 40.  Patient was made aware that she could probably at this time follow-up with her OB/GYN for an office visit, health department and information was given to her for this.  Patient reports minimal bleeding at this time and no cramping.  She was discharged to return to the emergency department should she develop any severe worsening of her symptoms or urgent concerns.      Patient's presentation is most consistent with acute complicated illness / injury requiring diagnostic workup.  FINAL CLINICAL IMPRESSION(S) / ED DIAGNOSES   Final diagnoses:  Ectopic pregnancy, unspecified location, unspecified whether intrauterine pregnancy present  Rx / DC Orders   ED Discharge Orders     None        Note:  This document was prepared using Dragon voice recognition software and may include unintentional dictation errors.   Johnn Hai, PA-C 02/08/22 1428    Vanessa Doniphan, MD 02/09/22 [phone]

## 2022-02-08 NOTE — Discharge Instructions (Addendum)
Follow-up with your primary care provider or you may also call and make an appointment with the [facility].  Your beta hCG is trending downward with the last beta hCG being 99 and today is 40.  Return to the emergency department if any severe worsening or urgent concerns otherwise she can follow-up with your primary care or health department.
